# Patient Record
Sex: Male | Born: 1990 | Race: Asian | Hispanic: No | Marital: Single | Smoking: Current some day smoker
Health system: Southern US, Community
[De-identification: ages and names within clinical notes are randomized; demographics above are authoritative.]

---

## 2017-03-22 ENCOUNTER — Emergency Department (HOSPITAL_COMMUNITY)
Admission: EM | Admit: 2017-03-22 | Discharge: 2017-03-22 | Disposition: A | Discharge status: Home / Self Care | Payer: Managed Care, Other (non HMO) | Attending: Emergency Medicine | Admitting: Emergency Medicine

## 2017-03-22 ENCOUNTER — Encounter (HOSPITAL_COMMUNITY): Payer: Self-pay | Admitting: Emergency Medicine

## 2017-03-22 DIAGNOSIS — R1010 Upper abdominal pain, unspecified: Secondary | ICD-10-CM

## 2017-03-22 DIAGNOSIS — K3184 Gastroparesis: Secondary | ICD-10-CM | POA: Insufficient documentation

## 2017-03-22 DIAGNOSIS — F17200 Nicotine dependence, unspecified, uncomplicated: Secondary | ICD-10-CM | POA: Diagnosis not present

## 2017-03-22 LAB — HEPATIC FUNCTION PANEL
ALBUMIN: 4.4 g/dL (ref 3.5–5.0)
ALT: 50 U/L (ref 17–63)
AST: 29 U/L (ref 15–41)
Alkaline Phosphatase: 84 U/L (ref 38–126)
BILIRUBIN TOTAL: 0.7 mg/dL (ref 0.3–1.2)
Bilirubin, Direct: 0.2 mg/dL (ref 0.1–0.5)
Indirect Bilirubin: 0.5 mg/dL (ref 0.3–0.9)
TOTAL PROTEIN: 7.4 g/dL (ref 6.5–8.1)

## 2017-03-22 LAB — CBC WITH DIFFERENTIAL/PLATELET
BASOS PCT: 0 %
Basophils Absolute: 0 10*3/uL (ref 0.0–0.1)
EOS ABS: 2.2 10*3/uL — AB (ref 0.0–0.7)
EOS PCT: 17 %
HCT: 49.3 % (ref 39.0–52.0)
HEMOGLOBIN: 16.7 g/dL (ref 13.0–17.0)
LYMPHS ABS: 3.7 10*3/uL (ref 0.7–4.0)
Lymphocytes Relative: 29 %
MCH: 27 pg (ref 26.0–34.0)
MCHC: 33.9 g/dL (ref 30.0–36.0)
MCV: 79.6 fL (ref 78.0–100.0)
MONOS PCT: 5 %
Monocytes Absolute: 0.6 10*3/uL (ref 0.1–1.0)
NEUTROS PCT: 49 %
Neutro Abs: 6 10*3/uL (ref 1.7–7.7)
PLATELETS: 207 10*3/uL (ref 150–400)
RBC: 6.19 MIL/uL — ABNORMAL HIGH (ref 4.22–5.81)
RDW: 12.5 % (ref 11.5–15.5)
WBC: 12.5 10*3/uL — ABNORMAL HIGH (ref 4.0–10.5)

## 2017-03-22 LAB — I-STAT CHEM 8, ED
BUN: 10 mg/dL (ref 6–20)
CALCIUM ION: 1.07 mmol/L — AB (ref 1.15–1.40)
Chloride: 100 mmol/L — ABNORMAL LOW (ref 101–111)
Creatinine, Ser: 0.8 mg/dL (ref 0.61–1.24)
Glucose, Bld: 93 mg/dL (ref 65–99)
HEMATOCRIT: 51 % (ref 39.0–52.0)
HEMOGLOBIN: 17.3 g/dL — AB (ref 13.0–17.0)
Potassium: 3.6 mmol/L (ref 3.5–5.1)
SODIUM: 141 mmol/L (ref 135–145)
TCO2: 28 mmol/L (ref 0–100)

## 2017-03-22 LAB — LIPASE, BLOOD: LIPASE: 39 U/L (ref 11–51)

## 2017-03-22 MED ORDER — RANITIDINE HCL 150 MG PO CAPS: 150.0000 mg | ORAL_CAPSULE | Freq: Every day | ORAL | 0 refills | Status: AC

## 2017-03-22 MED ORDER — OMEPRAZOLE 20 MG PO CPDR: 20.0000 mg | DELAYED_RELEASE_CAPSULE | Freq: Every day | ORAL | 0 refills | Status: AC

## 2017-03-22 NOTE — ED Triage Notes (Signed)
Pt states he has been having belly pain for 2-3 weeks. Comes and goes. Notices it is worse when he has an empty stomach...better when he eats or drinks something. Denies N/V/D

## 2017-03-22 NOTE — ED Notes (Signed)
Pt understood dc material. NAD Noted. Scripts given at dc. 

## 2017-03-22 NOTE — ED Provider Notes (Signed)
[facility]ENCY DEPT Provider Note   CSN: [id number] Arrival date & time: 03/22/17  0439     History   Chief Complaint Chief Complaint  Patient presents with  . Abdominal Pain    HPI Jason Dixon is a 26 y.o. male.  HPI   Generally healthy 26 year old male presenting complaining of abdominal pain. Patient states for the past 3 weeks he has been experiencing pain is periumbilical region and lower abdomen that has been intermittent nature, burning sensation, usually worse on an empty stomach and improves with eating. Pain has been increasing in frequency. He has been taking Pepto-Bismol initially with some improvement but now it does not provide adequate improvement. He works third shift and after and episodes of pain earlier tonight he decided to come to ER for further evaluation since urgent care is open at this time. Patient states pain is minimal currently. He smokes on occasion, denies alcohol abuse, admits to eating spicy food but denies alcohol abuse. He denies having fever, chills, lightheadedness, dizziness, chest pain, shortness of breath, nausea vomiting diarrhea, dysuria, or rash. Patient has not noticed any blood in his stool. States that his stool is a bit dark yesterday but actually to taking Pepto-Bismol  History reviewed. No pertinent past medical history.  There are no active problems to display for this patient.   History reviewed. No pertinent surgical history.     Home Medications    Prior to Admission medications   Not on File    Family History No family history on file.  Social History Social History  Substance Use Topics  . Smoking status: Current Some Day Smoker  . Smokeless tobacco: Never Used  . Alcohol use Not on file     Allergies   Patient has no allergy information on record.   Review of Systems Review of Systems  All other systems reviewed and are negative.    Physical Exam Updated Vital Signs BP (!) 112/98   Pulse 92    Temp 98.3 F (36.8 C)   Resp 18   Ht 6\' 1"  (1.854 m)   Wt 77.1 kg (170 lb)   SpO2 98%   BMI 22.43 kg/m   Physical Exam  Constitutional: He appears well-developed and well-nourished. No distress.  Well-appearing in no acute discomfort.  HENT:  Head: Atraumatic.  Eyes: Conjunctivae are normal.  Neck: Neck supple.  Cardiovascular: Normal rate and regular rhythm.   Pulmonary/Chest: Effort normal and breath sounds normal. No respiratory distress. He has no wheezes. He has no rales.  Abdominal: Soft. Bowel sounds are normal. He exhibits no distension. There is tenderness (Mild periumbilical tenderness and epigastric tenderness without guarding or rebound tenderness. Negative Murphy sign, no pain at McBurney's point.).  Neurological: He is alert.  Skin: No rash noted.  Psychiatric: He has a normal mood and affect.  Nursing note and vitals reviewed.    ED Treatments / Results  Labs (all labs ordered are listed, but only abnormal results are displayed) Labs Reviewed  CBC WITH DIFFERENTIAL/PLATELET - Abnormal; Notable for the following:       Result Value   WBC 12.5 (*)    RBC 6.19 (*)    Eosinophils Absolute 2.2 (*)    All other components within normal limits  I-STAT CHEM 8, ED - Abnormal; Notable for the following:    Chloride 100 (*)    Calcium, Ion 1.07 (*)    Hemoglobin 17.3 (*)    All other components within normal limits  HEPATIC FUNCTION  PANEL  LIPASE, BLOOD    EKG  EKG Interpretation None       Radiology No results found.  Procedures Procedures (including critical care time)  Medications Ordered in ED Medications - No data to display   Initial Impression / Assessment and Plan / ED Course  I have reviewed the triage vital signs and the nursing notes.  Pertinent labs & imaging results that were available during my care of the patient were reviewed by me and considered in my medical decision making (see chart for details).     BP (!) 112/98   Pulse  92   Temp 98.3 F (36.8 C)   Resp 18   Ht 6\' 1"  (1.854 m)   Wt 77.1 kg (170 lb)   SpO2 98%   BMI 22.43 kg/m    Final Clinical Impressions(s) / ED Diagnoses   Final diagnoses:  Pain of upper abdomen    New Prescriptions New Prescriptions   OMEPRAZOLE (PRILOSEC) 20 MG CAPSULE    Take 1 capsule (20 mg total) by mouth daily.   RANITIDINE (ZANTAC) 150 MG CAPSULE    Take 1 capsule (150 mg total) by mouth daily.   5:10 AM Patient here with epigastric and periumbilical abdominal tenderness suggestive of gastritis/duodenitis. No concerning features to suggest acute abdominal pathology such as appendicitis, or biliary disease.  5:55 AM Electrolytes panel are reassuring. Mildly elevated WBC of 12.5 however this is nonspecific and could be reactive. Normal hepatic function panel, normal lipase. At this time patient is stable for discharge with symptomatic treatment and outpatient follow-up with GI specialist for further care.  Care discussed with Dr. Patria Maneampos.    Fayrene Helperran, Batul Diego, PA-C 03/22/17 0600    Fayrene Helperran, Jaiyana Canale, PA-C 03/22/17 [id number]    Azalia Bilisampos, Kevin, MD 03/25/17 [phone]

## 2017-03-23 ENCOUNTER — Encounter (HOSPITAL_COMMUNITY): Payer: Self-pay

## 2017-03-23 ENCOUNTER — Other Ambulatory Visit: Payer: Self-pay | Admitting: Gastroenterology

## 2017-03-23 ENCOUNTER — Emergency Department (HOSPITAL_COMMUNITY)
Admission: EM | Admit: 2017-03-23 | Discharge: 2017-03-24 | Disposition: A | Discharge status: Home / Self Care | Payer: Managed Care, Other (non HMO) | Attending: Emergency Medicine | Admitting: Emergency Medicine

## 2017-03-23 ENCOUNTER — Emergency Department (HOSPITAL_COMMUNITY): Payer: Managed Care, Other (non HMO)

## 2017-03-23 DIAGNOSIS — F17200 Nicotine dependence, unspecified, uncomplicated: Secondary | ICD-10-CM | POA: Diagnosis not present

## 2017-03-23 DIAGNOSIS — Z79899 Other long term (current) drug therapy: Secondary | ICD-10-CM | POA: Insufficient documentation

## 2017-03-23 DIAGNOSIS — R1031 Right lower quadrant pain: Secondary | ICD-10-CM

## 2017-03-23 DIAGNOSIS — K529 Noninfective gastroenteritis and colitis, unspecified: Secondary | ICD-10-CM | POA: Diagnosis not present

## 2017-03-23 LAB — CBC WITH DIFFERENTIAL/PLATELET
Basophils Absolute: 0 10*3/uL (ref 0.0–0.1)
Basophils Relative: 0 %
EOS ABS: 0.2 10*3/uL (ref 0.0–0.7)
EOS PCT: 2 %
HCT: 44.5 % (ref 39.0–52.0)
HEMOGLOBIN: 14.8 g/dL (ref 13.0–17.0)
LYMPHS ABS: 1.7 10*3/uL (ref 0.7–4.0)
Lymphocytes Relative: 20 %
MCH: 26.4 pg (ref 26.0–34.0)
MCHC: 33.3 g/dL (ref 30.0–36.0)
MCV: 79.5 fL (ref 78.0–100.0)
MONO ABS: 0.5 10*3/uL (ref 0.1–1.0)
MONOS PCT: 6 %
Neutro Abs: 6 10*3/uL (ref 1.7–7.7)
Neutrophils Relative %: 72 %
Platelets: 213 10*3/uL (ref 150–400)
RBC: 5.6 MIL/uL (ref 4.22–5.81)
RDW: 12.4 % (ref 11.5–15.5)
WBC: 8.4 10*3/uL (ref 4.0–10.5)

## 2017-03-23 LAB — BASIC METABOLIC PANEL
Anion gap: 9 (ref 5–15)
BUN: 6 mg/dL (ref 6–20)
CHLORIDE: 104 mmol/L (ref 101–111)
CO2: 25 mmol/L (ref 22–32)
Calcium: 9 mg/dL (ref 8.9–10.3)
Creatinine, Ser: 1.05 mg/dL (ref 0.61–1.24)
GFR calc Af Amer: >60 mL/min (ref >60)
GFR calc non Af Amer: >60 mL/min (ref >60)
Glucose, Bld: 138 mg/dL — ABNORMAL HIGH (ref 65–99)
Potassium: 3.8 mmol/L (ref 3.5–5.1)
SODIUM: 138 mmol/L (ref 135–145)

## 2017-03-23 LAB — URINALYSIS, ROUTINE W REFLEX MICROSCOPIC
Bilirubin Urine: NEGATIVE
GLUCOSE, UA: NEGATIVE mg/dL
Hgb urine dipstick: NEGATIVE
Ketones, ur: NEGATIVE mg/dL
Nitrite: NEGATIVE
PROTEIN: NEGATIVE mg/dL
Specific Gravity, Urine: 1.023 (ref 1.005–1.030)
Squamous Epithelial / LPF: NONE SEEN
pH: 7 (ref 5.0–8.0)

## 2017-03-23 LAB — POC OCCULT BLOOD, ED: Fecal Occult Bld: NEGATIVE

## 2017-03-23 LAB — LIPASE, BLOOD: LIPASE: 38 U/L (ref 11–51)

## 2017-03-23 MED ORDER — IOPAMIDOL (ISOVUE-300) INJECTION 61%
INTRAVENOUS | Status: AC
  Administered 2017-03-23: 100 mL
  Filled 2017-03-23: qty 100

## 2017-03-23 MED ORDER — SODIUM CHLORIDE 0.9 % IV BOLUS (SEPSIS)
1000.0000 mL | Freq: Once | INTRAVENOUS | Status: AC
  Administered 2017-03-23: 1000 mL via INTRAVENOUS

## 2017-03-23 MED ORDER — MORPHINE SULFATE (PF) 4 MG/ML IV SOLN
4.0000 mg | Freq: Once | INTRAVENOUS | Status: AC
  Administered 2017-03-23: 4 mg via INTRAVENOUS
  Filled 2017-03-23: qty 1

## 2017-03-23 MED ORDER — ONDANSETRON 4 MG PO TBDP
4.0000 mg | ORAL_TABLET | Freq: Once | ORAL | Status: AC
  Administered 2017-03-23: 4 mg via ORAL

## 2017-03-23 MED ORDER — ONDANSETRON HCL 4 MG/2ML IJ SOLN
4.0000 mg | Freq: Once | INTRAMUSCULAR | Status: AC
  Administered 2017-03-23: 4 mg via INTRAVENOUS
  Filled 2017-03-23: qty 2

## 2017-03-23 MED ORDER — ONDANSETRON 4 MG PO TBDP
ORAL_TABLET | ORAL | Status: AC
  Filled 2017-03-23: qty 1

## 2017-03-23 NOTE — ED Provider Notes (Signed)
[facility] Provider Note   CSN: [id number] Arrival date & time: 03/23/17  1858     History   Chief Complaint Chief Complaint  Patient presents with  . Abdominal Pain    HPI Jason Dixon is a 26 y.o. male.  HPI   Patient is a 26 year old male with no pertinent past medical history presents the ED with complaint of constant worsening right lower abdominal pain. Patient reports he has been having waxing and waning pain to his abdomen for the past 2 weeks but notes yesterday morning it has significantly worsened. He reports now having constant sharp burning pain to his right lower quadrant which is worsened when laying supine. Denies any alleviating factors. Endorses associated nausea. Patient also reports having intermittent black loose stools for the past 2 days. He notes he was initially seen in the ED Monday morning for his symptoms, noted to have mildly elevated WBC of 12.5, labs otherwise unremarkable. Patient was diagnosed with suspected gastritis and discharged home with Prilosec, Zantac and outpatient GI follow-up. Patient reports seen GI in the clinic today and states he was advised to come to the ED to have a CT scan performed due to concern for appendicitis. Patient reports he has been taking his prescriptions a Prilosec and Zantac without relief of symptoms. Denies fever, chills, cough, shortness of breath, chest pain, vomiting, urinary symptoms, flank pain, penile or testicular pain/swelling. Denies history of abdominal surgeries.  History reviewed. No pertinent past medical history.  There are no active problems to display for this patient.   History reviewed. No pertinent surgical history.     Home Medications    Prior to Admission medications   Medication Sig Start Date End Date Taking? Authorizing Provider  omeprazole (PRILOSEC) 20 MG capsule Take 1 capsule (20 mg total) by mouth daily. 03/22/17  Yes Fayrene Helper, PA-C  ranitidine (ZANTAC) 150 MG capsule  Take 1 capsule (150 mg total) by mouth daily. 03/22/17  Yes Fayrene Helper, PA-C  dicyclomine (BENTYL) 20 MG tablet Take 1 tablet (20 mg total) by mouth 2 (two) times daily. 03/24/17   Barrett Henle, PA-C  loperamide (IMODIUM) 2 MG capsule Take 1 capsule (2 mg total) by mouth 4 (four) times daily as needed for diarrhea or loose stools. 03/24/17   Barrett Henle, PA-C  ondansetron (ZOFRAN ODT) 4 MG disintegrating tablet Take 1 tablet (4 mg total) by mouth every 8 (eight) hours as needed for nausea or vomiting. 03/24/17   Barrett Henle, PA-C    Family History History reviewed. No pertinent family history.  Social History Social History  Substance Use Topics  . Smoking status: Current Some Day Smoker  . Smokeless tobacco: Never Used  . Alcohol use Not on file     Allergies   Patient has no known allergies.   Review of Systems Review of Systems  Gastrointestinal: Positive for abdominal pain and nausea.       Black stool  All other systems reviewed and are negative.    Physical Exam Updated Vital Signs BP 122/82   Pulse (!) 117   Temp 99.2 F (37.3 C) (Oral)   Resp 20   SpO2 98%   Physical Exam  Constitutional: He is oriented to person, place, and time. He appears well-developed and well-nourished.  Pt sitting on edge of chair intermittently grimacing throughout exam, appears to be in moderate discomfort.  HENT:  Head: Normocephalic and atraumatic.  Mouth/Throat: Uvula is midline, oropharynx is clear and moist and  mucous membranes are normal. No oropharyngeal exudate, posterior oropharyngeal edema, posterior oropharyngeal erythema or tonsillar abscesses. No tonsillar exudate.  Eyes: Conjunctivae and EOM are normal. Right eye exhibits no discharge. Left eye exhibits no discharge. No scleral icterus.  Neck: Normal range of motion. Neck supple.  Cardiovascular: Normal rate, regular rhythm, normal heart sounds and intact distal pulses.   Pulmonary/Chest:  Effort normal and breath sounds normal. No respiratory distress. He has no wheezes. He has no rales. He exhibits no tenderness.  Abdominal: Soft. Normal appearance and bowel sounds are normal. He exhibits no distension and no mass. There is tenderness. There is rebound and tenderness at McBurney's point. There is no rigidity, no guarding, no CVA tenderness and negative Murphy's sign. No hernia.  Mild diffuse tenderness, worse over periumbilical and RLQ.   Genitourinary: Rectum normal. Rectal exam shows no external hemorrhoid, no internal hemorrhoid, no mass and no tenderness.  Genitourinary Comments: No gross blood on DRE  Musculoskeletal: Normal range of motion. He exhibits no edema.  Neurological: He is alert and oriented to person, place, and time.  Skin: Skin is warm and dry.  Nursing note and vitals reviewed.    ED Treatments / Results  Labs (all labs ordered are listed, but only abnormal results are displayed) Labs Reviewed  BASIC METABOLIC PANEL - Abnormal; Notable for the following:       Result Value   Glucose, Bld 138 (*)    All other components within normal limits  URINALYSIS, ROUTINE W REFLEX MICROSCOPIC - Abnormal; Notable for the following:    Leukocytes, UA TRACE (*)    Bacteria, UA RARE (*)    All other components within normal limits  CBC WITH DIFFERENTIAL/PLATELET  LIPASE, BLOOD  POC OCCULT BLOOD, ED    EKG  EKG Interpretation None       Radiology Ct Abdomen Pelvis W Contrast  Result Date: 03/23/2017 CLINICAL DATA:  Right lower quadrant abdominal pain. EXAM: CT ABDOMEN AND PELVIS WITH CONTRAST TECHNIQUE: Multidetector CT imaging of the abdomen and pelvis was performed using the standard protocol following bolus administration of intravenous contrast. CONTRAST:  ISOVUE-300 IOPAMIDOL (ISOVUE-300) INJECTION 61% COMPARISON:  None. FINDINGS: Lower chest: The lung bases are clear. Hepatobiliary: No focal liver abnormality is seen. No gallstones, gallbladder  wall thickening, or biliary dilatation. Pancreas: No ductal dilatation or inflammation. Spleen: Normal in size without focal abnormality. Adrenals/Urinary Tract: Normal adrenal glands. No hydronephrosis or perinephric edema. Homogeneous renal enhancement. Urinary bladder is physiologically distended, no bladder wall thickening. Stomach/Bowel: Normal appendix well-visualized on the right lower quadrant. Stomach is physiologically distended. Prominent fluid-filled small bowel in the central abdomen without wall thickening or surrounding inflammation. Terminal ileum is normal. Small volume of stool throughout the colon, no colonic wall thickening. Vascular/Lymphatic: Small retroperitoneal nodes are not enlarged by size criteria. No acute vascular findings are present. Reproductive: Prostate is unremarkable. Other: No free air, free fluid, or intra-abdominal fluid collection. Musculoskeletal: There are no acute or suspicious osseous abnormalities. IMPRESSION: 1. Normal appendix. 2. Prominent fluid-filled small bowel in the central abdomen, can be seen with enteritis or ileus. No bowel inflammation or wall thickening. Electronically Signed   By: Rubye Oaks M.D.   On: 03/23/2017 22:35    Procedures Procedures (including critical care time)  Medications Ordered in ED Medications  ondansetron (ZOFRAN-ODT) 4 MG disintegrating tablet (not administered)  ondansetron (ZOFRAN-ODT) disintegrating tablet 4 mg (4 mg Oral Given 03/23/17 1935)  sodium chloride 0.9 % bolus 1,000 mL (0 mLs Intravenous  Stopped 03/23/17 2348)  ondansetron (ZOFRAN) injection 4 mg (4 mg Intravenous Given 03/23/17 2143)  morphine 4 MG/ML injection 4 mg (4 mg Intravenous Given 03/23/17 2143)  iopamidol (ISOVUE-300) 61 % injection (100 mLs  Contrast Given 03/23/17 2213)     Initial Impression / Assessment and Plan / ED Course  I have reviewed the triage vital signs and the nursing notes.  Pertinent labs & imaging results that were available  during my care of the patient were reviewed by me and considered in my medical decision making (see chart for details).     Patient presents with worsening right lower quadrant abdominal pain that has been present for the past 2 weeks but worsened yesterday morning. Endorses associated nausea and black loose stool. Reports he was evaluated by GI prior to arrival and advised to come to the ED for CT scan due to concern for appendicitis. Denies fever. VSS. Exam revealed mild diffuse abdominal tenderness, worse over periumbilical and right lower quadrant with positive rebound and McBurney's point tenderness. Patient given IV fluids, pain meds and Zofran in the ED. CT abdomen ordered for further evaluation of worsening abdominal pain.   Chart review shows patient was seen in the ED yesterday morning for similar symptoms. Patient with mild leukocytosis, otherwise labs unremarkable. Patient was discharged home with Prilosec and Zantac and advised to follow up with GI for further care.  Labs and urine unremarkable. Negative Hemoccult. CT abdomen revealed normal appendix, evidence suggesting enteritis, no bowel inflammation or wall thickening. On reevaluation patient reports significant improvement of symptoms. Pt tolerating PO. Discussed results and plan for discharge with patient. Plan to discharge patient home with symptom back treatment for viral gastroenteritis. Advised patient to follow up with GI in 3-4 days as needed. Discussed return precautions.  Final Clinical Impressions(s) / ED Diagnoses   Final diagnoses:  Gastroenteritis    New Prescriptions New Prescriptions   DICYCLOMINE (BENTYL) 20 MG TABLET    Take 1 tablet (20 mg total) by mouth 2 (two) times daily.   LOPERAMIDE (IMODIUM) 2 MG CAPSULE    Take 1 capsule (2 mg total) by mouth 4 (four) times daily as needed for diarrhea or loose stools.   ONDANSETRON (ZOFRAN ODT) 4 MG DISINTEGRATING TABLET    Take 1 tablet (4 mg total) by mouth every 8  (eight) hours as needed for nausea or vomiting.     Barrett Henleadeau, Nicole Elizabeth, PA-C 03/24/17 [id number]    Pricilla LovelessGoldston, Scott, MD 03/25/17 [phone]

## 2017-03-23 NOTE — ED Triage Notes (Signed)
Pt presents with continued RLQ abdominal pain. Pt seen here early Monday morning.  Pt was discharged without imaging; was to follow up with GI which he did today; was referred here for increased pain and CT scan.  Pt reports having black stools, constant nausea.

## 2017-03-24 ENCOUNTER — Other Ambulatory Visit: Payer: Managed Care, Other (non HMO)

## 2017-03-24 MED ORDER — ONDANSETRON 4 MG PO TBDP: 4.0000 mg | ORAL_TABLET | Freq: Three times a day (TID) | ORAL | 0 refills | Status: AC | PRN

## 2017-03-24 MED ORDER — DICYCLOMINE HCL 20 MG PO TABS: 20.0000 mg | ORAL_TABLET | Freq: Two times a day (BID) | ORAL | 0 refills | Status: AC

## 2017-03-24 MED ORDER — LOPERAMIDE HCL 2 MG PO CAPS: 2.0000 mg | ORAL_CAPSULE | Freq: Four times a day (QID) | ORAL | 0 refills | Status: AC | PRN

## 2017-03-24 NOTE — Discharge Instructions (Signed)
Take your medications as prescribed as needed for nausea, vomiting and/or diarrhea. Continue to give fluids at home to remain hydrated. I recommend eating a bland diet for the next few days until her symptoms have improved. I recommend following up with her gastroenterologist in 3-4 days if your symptoms have not improved or have worsened. Please return to the Emergency Department if symptoms worsen or new onset of fever, chest pain, difficulty breathing, new/worsening abdominal pain, vomiting blood, able to keep fluids down, blood in urine or stool.

## 2018-08-18 IMAGING — CT CT ABD-PELV W/ CM
2 of 4 series · 16 of 46 positions shown, 18 images · IV contrast (Omni 300)
Comparison: None.

CLINICAL DATA: Right lower quadrant abdominal pain.

EXAM:
CT ABDOMEN AND PELVIS WITH CONTRAST
TECHNIQUE: Multidetector CT imaging of the abdomen and pelvis was performed
using the standard protocol following bolus administration of
intravenous contrast.
CONTRAST:  100mL [id number] IOPAMIDOL ([id number]) INJECTION 61%

[Series 3: a/p w/ 5mm · axial · 0.79mm/px · z∈[+934,+1364]mm · 13 of 94 slices shown, 15 images]
[im 4/94  soft-tissue]
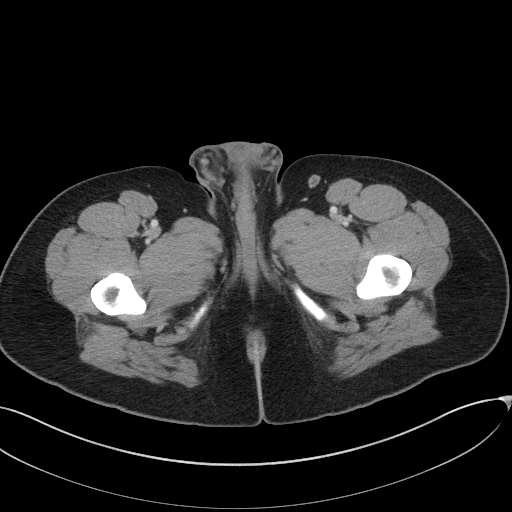
[im 4/94  bone]
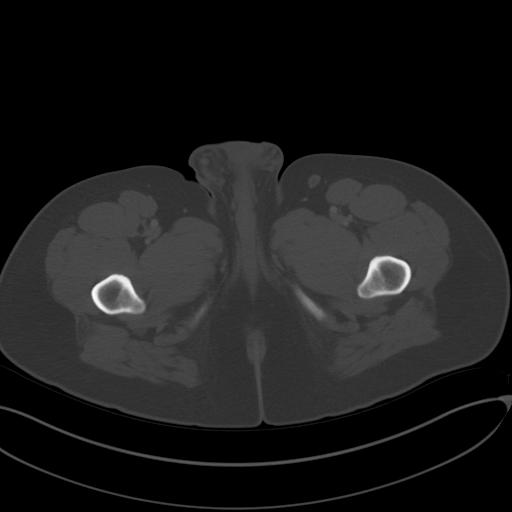
[im 12/94  soft-tissue]
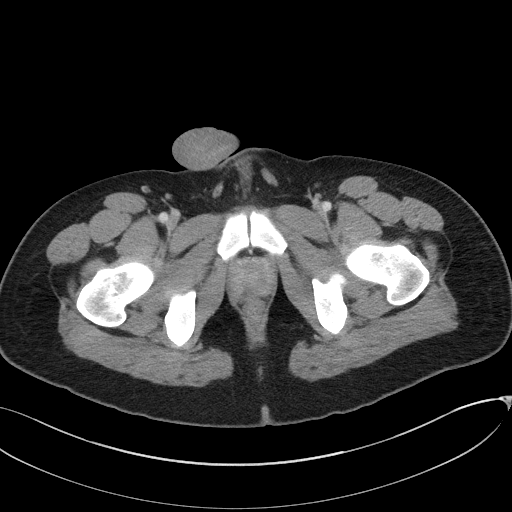
[im 19/94  soft-tissue]
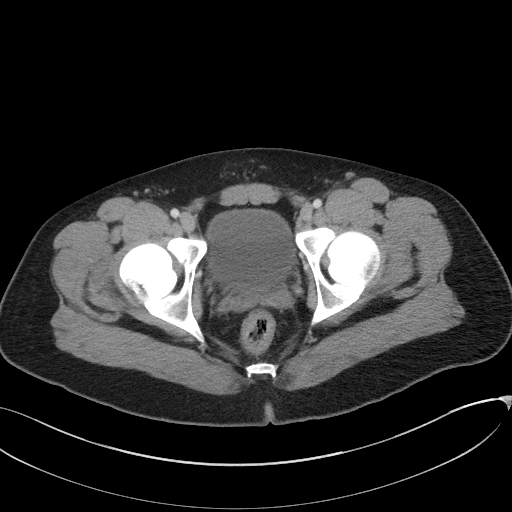
[im 27/94  soft-tissue]
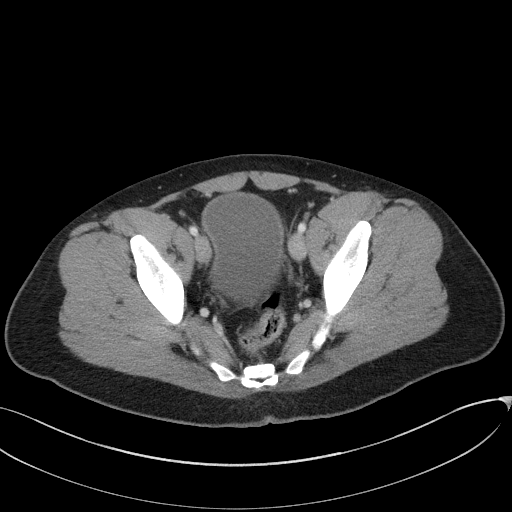
[im 34/94  soft-tissue]
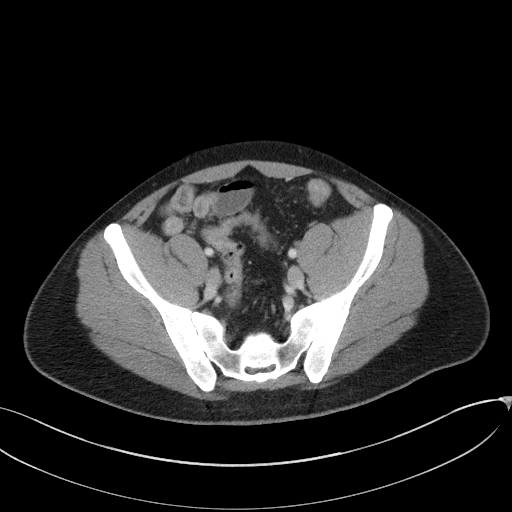
[im 41/94  soft-tissue]
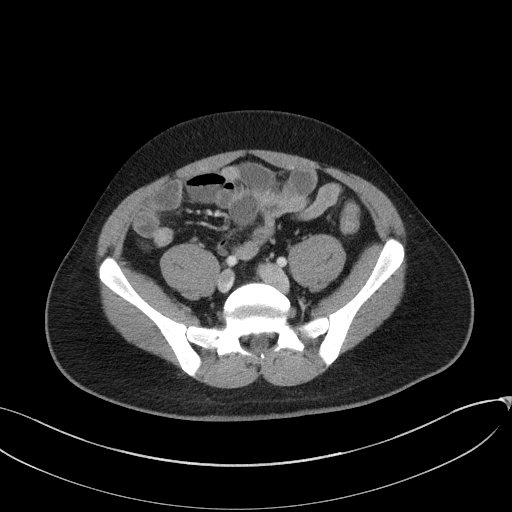
[im 49/94  soft-tissue]
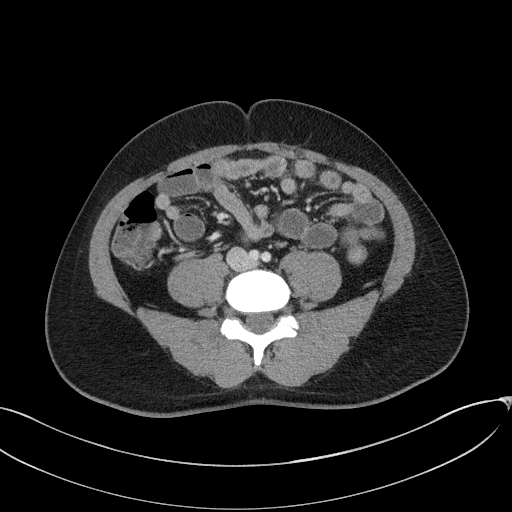
[im 53/94  soft-tissue]
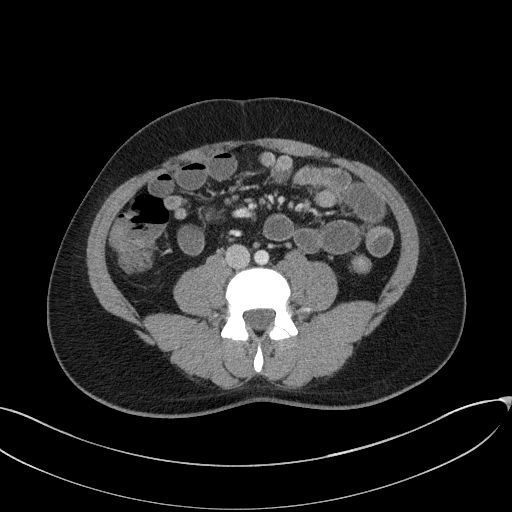
[im 60/94  soft-tissue]
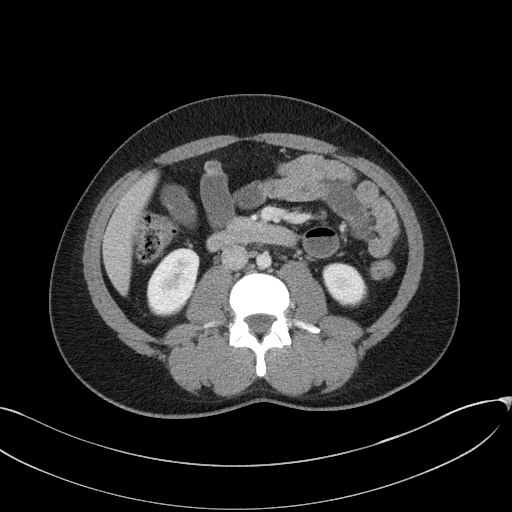
[im 60/94  bone]
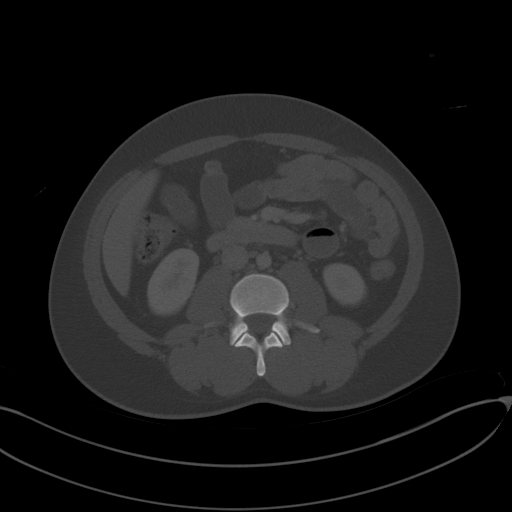
[im 67/94  soft-tissue]
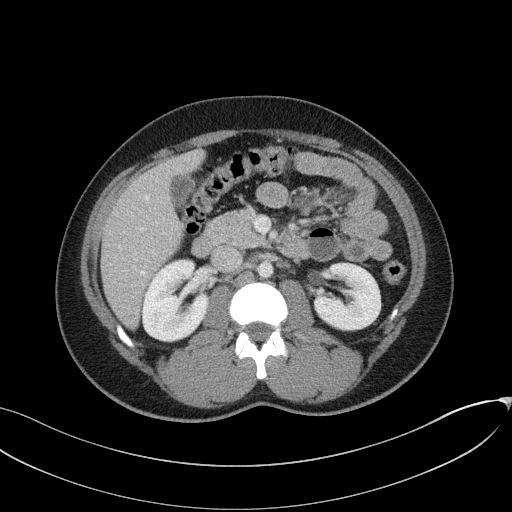
[im 75/94  soft-tissue]
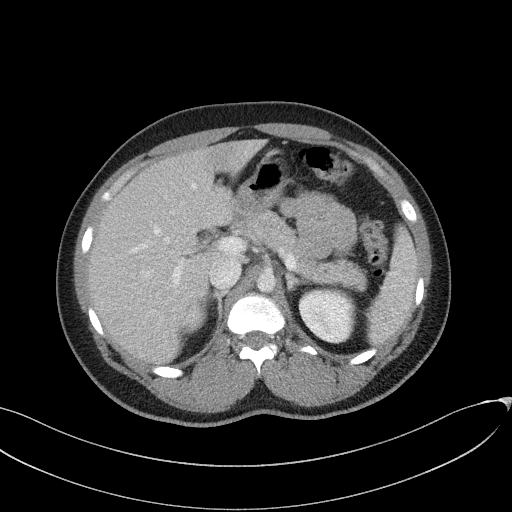
[im 82/94  soft-tissue]
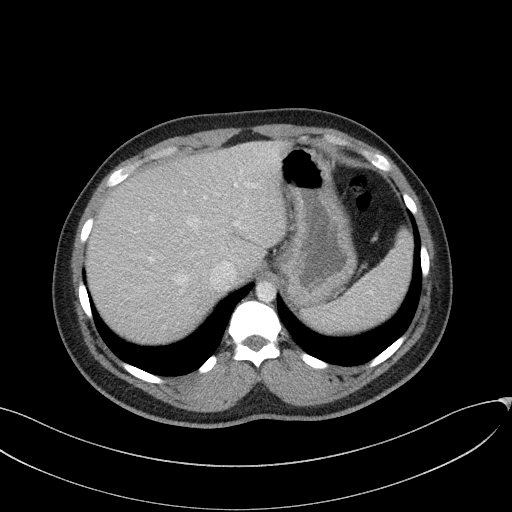
[im 90/94  soft-tissue]
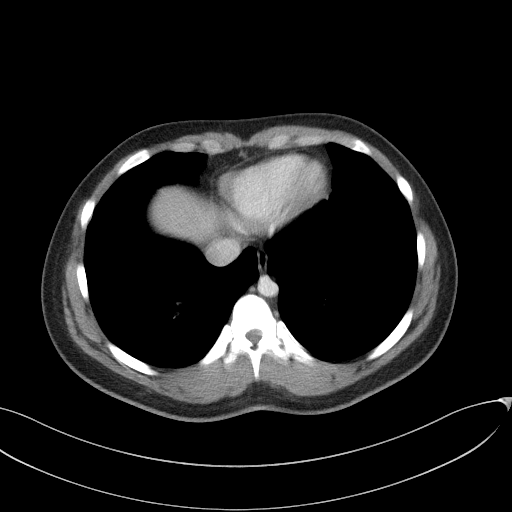

[Series 6: a/p w/ cor · coronal · 0.74mm/px · 3 of 149 slices shown]
[im 50/149  soft-tissue]
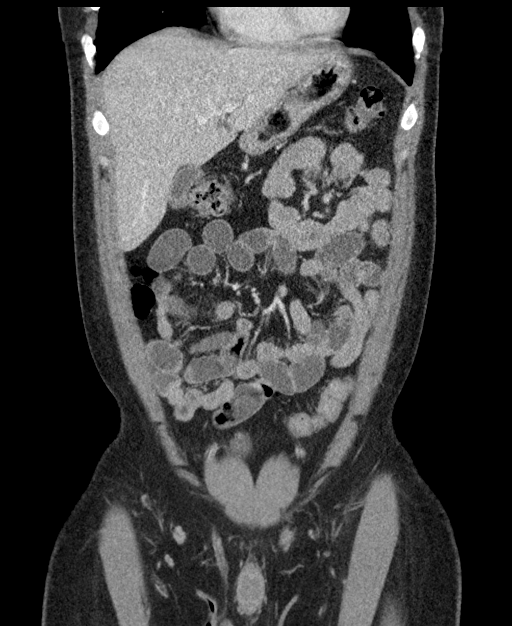
[im 66/149  soft-tissue]
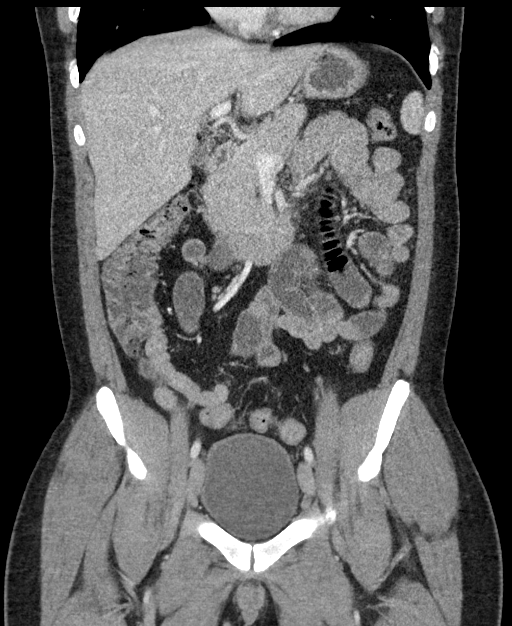
[im 83/149  soft-tissue]
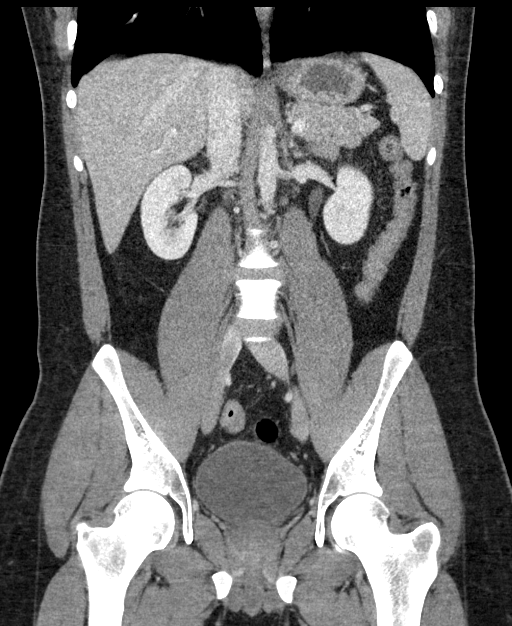

[16 of 46 positions shown; findings below may reference images not displayed]

FINDINGS: Lower chest: The lung bases are clear.

Hepatobiliary: No focal liver abnormality is seen. No gallstones,
gallbladder wall thickening, or biliary dilatation.

Pancreas: No ductal dilatation or inflammation.

Spleen: Normal in size without focal abnormality.

Adrenals/Urinary Tract: Normal adrenal glands. No hydronephrosis or
perinephric edema. Homogeneous renal enhancement. Urinary bladder is
physiologically distended, no bladder wall thickening.

Stomach/Bowel: Normal appendix well-visualized on the right lower
quadrant. Stomach is physiologically distended. Prominent
fluid-filled small bowel in the central abdomen without wall
thickening or surrounding inflammation. Terminal ileum is normal.
Small volume of stool throughout the colon, no colonic wall
thickening.

Vascular/Lymphatic: Small retroperitoneal nodes are not enlarged by
size criteria. No acute vascular findings are present.

Reproductive: Prostate is unremarkable.

Other: No free air, free fluid, or intra-abdominal fluid collection.

Musculoskeletal: There are no acute or suspicious osseous
abnormalities.
IMPRESSION: 1. Normal appendix.
2. Prominent fluid-filled small bowel in the central abdomen, can be
seen with enteritis or ileus. No bowel inflammation or wall
thickening.

## 2018-10-10 ENCOUNTER — Encounter (HOSPITAL_COMMUNITY): Payer: Self-pay

## 2018-10-10 ENCOUNTER — Emergency Department (HOSPITAL_COMMUNITY)
Admission: EM | Admit: 2018-10-10 | Discharge: 2018-10-10 | Disposition: A | Discharge status: Home / Self Care | Payer: Managed Care, Other (non HMO) | Attending: Emergency Medicine | Admitting: Emergency Medicine

## 2018-10-10 DIAGNOSIS — Z79899 Other long term (current) drug therapy: Secondary | ICD-10-CM | POA: Insufficient documentation

## 2018-10-10 DIAGNOSIS — F17200 Nicotine dependence, unspecified, uncomplicated: Secondary | ICD-10-CM | POA: Diagnosis not present

## 2018-10-10 DIAGNOSIS — R509 Fever, unspecified: Secondary | ICD-10-CM

## 2018-10-10 DIAGNOSIS — J111 Influenza due to unidentified influenza virus with other respiratory manifestations: Secondary | ICD-10-CM | POA: Insufficient documentation

## 2018-10-10 DIAGNOSIS — R6889 Other general symptoms and signs: Secondary | ICD-10-CM

## 2018-10-10 DIAGNOSIS — J029 Acute pharyngitis, unspecified: Secondary | ICD-10-CM | POA: Diagnosis present

## 2018-10-10 LAB — GROUP A STREP BY PCR: Group A Strep by PCR: NOT DETECTED

## 2018-10-10 MED ORDER — ACETAMINOPHEN 500 MG PO TABS
1000.0000 mg | ORAL_TABLET | Freq: Once | ORAL | Status: AC
  Administered 2018-10-10: 1000 mg via ORAL
  Filled 2018-10-10: qty 2

## 2018-10-10 MED ORDER — ALUM & MAG HYDROXIDE-SIMETH 200-200-20 MG/5ML PO SUSP
30.0000 mL | Freq: Once | ORAL | Status: AC
  Administered 2018-10-10: 30 mL via ORAL
  Filled 2018-10-10: qty 30

## 2018-10-10 MED ORDER — IBUPROFEN 600 MG PO TABS: 600.0000 mg | ORAL_TABLET | Freq: Four times a day (QID) | ORAL | 0 refills | Status: AC | PRN

## 2018-10-10 MED ORDER — IBUPROFEN 800 MG PO TABS
800.0000 mg | ORAL_TABLET | Freq: Once | ORAL | Status: AC
  Administered 2018-10-10: 800 mg via ORAL
  Filled 2018-10-10: qty 1

## 2018-10-10 MED ORDER — LIDOCAINE VISCOUS HCL 2 % MT SOLN
15.0000 mL | Freq: Once | OROMUCOSAL | Status: AC
  Administered 2018-10-10: 15 mL via ORAL
  Filled 2018-10-10: qty 15

## 2018-10-10 MED ORDER — BENZONATATE 100 MG PO CAPS: 100.0000 mg | ORAL_CAPSULE | Freq: Three times a day (TID) | ORAL | 0 refills | Status: AC | PRN

## 2018-10-10 MED ORDER — MAGIC MOUTHWASH W/LIDOCAINE: 5.0000 mL | Freq: Four times a day (QID) | ORAL | 0 refills | Status: AC | PRN

## 2018-10-10 NOTE — ED Provider Notes (Signed)
[facility] Provider Note   CSN: [id number] Arrival date & time: 10/10/18  0112     History   Chief Complaint Chief Complaint  Patient presents with  . Nasal Congestion    Flu-like Symptoms    HPI Jason CoupeDavindra Dixon is a 27 y.o. male.   27 year old male with no significant past medical history presents to the emergency department for evaluation of flulike illness.  States that he began with a sore throat 3 days ago.  Sore throat progressed to diffuse body aches with subjective fever and chills.  He has had a dry cough and has been managing his symptoms with Robitussin, Delsym, TheraFlu.  Also reports taking ibuprofen sporadically for management of throat pain.  Has been tolerating liquids, but states that eating solids aggravates his throat.  He has not had any vomiting, diarrhea, shortness of breath, abdominal pain.  Works in a call center.  Is unsure of any specific sick contacts.  Continues to void normally.  The history is provided by the patient. No language interpreter was used.    History reviewed. No pertinent past medical history.  There are no active problems to display for this patient.   History reviewed. No pertinent surgical history.     Home Medications    Prior to Admission medications   Medication Sig Start Date End Date Taking? Authorizing Provider  benzonatate (TESSALON) 100 MG capsule Take 1 capsule (100 mg total) by mouth 3 (three) times daily as needed for cough. 10/10/18   Antony MaduraHumes, Jorgia Manthei, PA-C  dicyclomine (BENTYL) 20 MG tablet Take 1 tablet (20 mg total) by mouth 2 (two) times daily. 03/24/17   Barrett HenleNadeau, Nicole Elizabeth, PA-C  ibuprofen (ADVIL,MOTRIN) 600 MG tablet Take 1 tablet (600 mg total) by mouth every 6 (six) hours as needed for fever, mild pain or moderate pain. 10/10/18   Antony MaduraHumes, Izeah Vossler, PA-C  loperamide (IMODIUM) 2 MG capsule Take 1 capsule (2 mg total) by mouth 4 (four) times daily as needed for diarrhea or  loose stools. 03/24/17   Barrett HenleNadeau, Nicole Elizabeth, PA-C  magic mouthwash w/lidocaine SOLN Take 5 mLs by mouth 4 (four) times daily as needed for mouth pain. 10/10/18   Antony MaduraHumes, Tamera Pingley, PA-C  omeprazole (PRILOSEC) 20 MG capsule Take 1 capsule (20 mg total) by mouth daily. 03/22/17   Fayrene Helperran, Bowie, PA-C  ondansetron (ZOFRAN ODT) 4 MG disintegrating tablet Take 1 tablet (4 mg total) by mouth every 8 (eight) hours as needed for nausea or vomiting. 03/24/17   Barrett HenleNadeau, Nicole Elizabeth, PA-C  ranitidine (ZANTAC) 150 MG capsule Take 1 capsule (150 mg total) by mouth daily. 03/22/17   Fayrene Helperran, Bowie, PA-C    Family History History reviewed. No pertinent family history.  Social History Social History   Tobacco Use  . Smoking status: Current Some Day Smoker  . Smokeless tobacco: Never Used  Substance Use Topics  . Alcohol use: Not on file  . Drug use: Not on file     Allergies   Patient has no known allergies.   Review of Systems Review of Systems Ten systems reviewed and are negative for acute change, except as noted in the HPI.    Physical Exam Updated Vital Signs BP 118/72   Pulse (!) 110   Temp (!) 100.5 F (38.1 C) (Oral)   Resp 19   SpO2 98%   Physical Exam Vitals signs and nursing note reviewed.  Constitutional:      General: He is not in acute distress.  Appearance: He is well-developed. He is not diaphoretic.     Comments: Nontoxic-appearing and in no distress  HENT:     Head: Normocephalic and atraumatic.     Nose:     Comments: Posterior oropharyngeal erythema.  Scant exudate on tonsils.  No edema.  Uvula midline.  Patient tolerating secretions without difficulty.  No tripoding or voice muffling. Eyes:     General: No scleral icterus.    Conjunctiva/sclera: Conjunctivae normal.  Neck:     Musculoskeletal: Normal range of motion.     Comments: No meningismus Cardiovascular:     Rate and Rhythm: Regular rhythm. Tachycardia present.     Pulses: Normal pulses.     Comments:  Tachycardia in the setting of febrile illness Pulmonary:     Effort: Pulmonary effort is normal. No respiratory distress.     Comments: Respirations even and unlabored Musculoskeletal: Normal range of motion.  Skin:    General: Skin is warm and dry.     Coloration: Skin is not pale.     Findings: No erythema or rash.  Neurological:     Mental Status: He is alert and oriented to person, place, and time.     Coordination: Coordination normal.  Psychiatric:        Behavior: Behavior normal.      ED Treatments / Results  Labs (all labs ordered are listed, but only abnormal results are displayed) Labs Reviewed  GROUP A STREP BY PCR    EKG None  Radiology No results found.  Procedures Procedures (including critical care time)  Medications Ordered in ED Medications  ibuprofen (ADVIL,MOTRIN) tablet 800 mg (800 mg Oral Given 10/10/18 0137)  alum & mag hydroxide-simeth (MAALOX/MYLANTA) 200-200-20 MG/5ML suspension 30 mL (30 mLs Oral Given 10/10/18 0258)    And  lidocaine (XYLOCAINE) 2 % viscous mouth solution 15 mL (15 mLs Oral Given 10/10/18 0258)  acetaminophen (TYLENOL) tablet 1,000 mg (1,000 mg Oral Given 10/10/18 0258)     Initial Impression / Assessment and Plan / ED Course  I have reviewed the triage vital signs and the nursing notes.  Pertinent labs & imaging results that were available during my care of the patient were reviewed by me and considered in my medical decision making (see chart for details).     Patient with symptoms consistent with influenza.  Vitals are stable, low-grade fever.  No signs of dehydration, tolerating PO's.  Lungs are clear.  Doubt PNA given lack of tachypnea, dyspnea, hypoxia.  The patient understands that symptoms are greater than the recommended 24-48 hour window of treatment with [place]fu.  Patient will be discharged with instructions to orally hydrate, rest, and use over-the-counter medications such as ibuprofen for body aches and  Tylenol for fever.  Patient will also be given a cough suppressant.  Return precautions discussed and provided. Patient discharged in stable condition with no unaddressed concerns.   Final Clinical Impressions(s) / ED Diagnoses   Final diagnoses:  Flu-like symptoms  Febrile illness    ED Discharge Orders         Ordered    magic mouthwash w/lidocaine SOLN  4 times daily PRN    Note to Pharmacy:  Doree Barthel, Maalox, Lidocaine at 1:1:1   10/10/18 0340    ibuprofen (ADVIL,MOTRIN) 600 MG tablet  Every 6 hours PRN     10/10/18 0340    benzonatate (TESSALON) 100 MG capsule  3 times daily PRN     10/10/18 0340  Antony MaduraHumes, Brodyn Depuy, PA-C 10/10/18 [id number]    Geoffery Lyonselo, Douglas, MD 10/10/18 [id number]

## 2018-10-10 NOTE — ED Notes (Signed)
Patient verbalizes understanding of medications and discharge instructions. No further questions at this time. VSS and patient ambulatory at discharge.   

## 2018-10-10 NOTE — Discharge Instructions (Signed)
Your symptoms are consistent with influenza. We advise ibuprofen every 6 hours as prescribed. You may alternate this with Tylenol, if desired. Be sure to drink plenty of fluids to prevent dehydration. Use Tessalon for cough and magic mouthwash for sore throat. Follow-up with your primary care doctor in the next 24-48 hours for recheck. You may return for new or concerning symptoms.

## 2018-10-10 NOTE — ED Triage Notes (Signed)
Pt to ER with c/o flu-like symptoms; states that he has had temp,chills, and dry cough with body aches since Friday

## 2021-07-31 ENCOUNTER — Encounter (HOSPITAL_COMMUNITY): Payer: Self-pay | Admitting: Emergency Medicine

## 2021-07-31 ENCOUNTER — Emergency Department (HOSPITAL_COMMUNITY)
Admission: EM | Admit: 2021-07-31 | Discharge: 2021-07-31 | Disposition: A | Discharge status: Home / Self Care | Payer: 59 | Attending: Emergency Medicine | Admitting: Emergency Medicine

## 2021-07-31 ENCOUNTER — Other Ambulatory Visit: Payer: Self-pay

## 2021-07-31 DIAGNOSIS — S5012XA Contusion of left forearm, initial encounter: Secondary | ICD-10-CM | POA: Insufficient documentation

## 2021-07-31 DIAGNOSIS — F17200 Nicotine dependence, unspecified, uncomplicated: Secondary | ICD-10-CM | POA: Insufficient documentation

## 2021-07-31 DIAGNOSIS — X58XXXA Exposure to other specified factors, initial encounter: Secondary | ICD-10-CM | POA: Insufficient documentation

## 2021-07-31 DIAGNOSIS — T148XXA Other injury of unspecified body region, initial encounter: Secondary | ICD-10-CM

## 2021-07-31 DIAGNOSIS — S59912A Unspecified injury of left forearm, initial encounter: Secondary | ICD-10-CM | POA: Diagnosis present

## 2021-07-31 NOTE — ED Provider Notes (Signed)
[facility]-EMERGENCY DEPT Provider Note   CSN: [id number] Arrival date & time: 07/31/21  1823     History Chief Complaint  Patient presents with   Arm Pain    Jason Dixon is a 30 y.o. male who presents for evaluation of left arm pain.  Patient donated plasma 2 days ago.  Yesterday he noticed there was bruising and swelling in the left antecubital fossa.  He had some numbness and tingling in his hand that has resolved.  Today he noticed that the bruising had traveled further down his arm and came in for further evaluation.  He denies weakness, fever or chills.   Arm Pain      History reviewed. No pertinent past medical history.  There are no problems to display for this patient.   History reviewed. No pertinent surgical history.     History reviewed. No pertinent family history.  Social History   Tobacco Use   Smoking status: Some Days   Smokeless tobacco: Never    Home Medications Prior to Admission medications   Medication Sig Start Date End Date Taking? Authorizing Provider  benzonatate (TESSALON) 100 MG capsule Take 1 capsule (100 mg total) by mouth 3 (three) times daily as needed for cough. 10/10/18   Antony Madura, PA-C  dicyclomine (BENTYL) 20 MG tablet Take 1 tablet (20 mg total) by mouth 2 (two) times daily. 03/24/17   Barrett Henle, PA-C  ibuprofen (ADVIL,MOTRIN) 600 MG tablet Take 1 tablet (600 mg total) by mouth every 6 (six) hours as needed for fever, mild pain or moderate pain. 10/10/18   Antony Madura, PA-C  loperamide (IMODIUM) 2 MG capsule Take 1 capsule (2 mg total) by mouth 4 (four) times daily as needed for diarrhea or loose stools. 03/24/17   Barrett Henle, PA-C  magic mouthwash w/lidocaine SOLN Take 5 mLs by mouth 4 (four) times daily as needed for mouth pain. 10/10/18   Antony Madura, PA-C  omeprazole (PRILOSEC) 20 MG capsule Take 1 capsule (20 mg total) by mouth daily. 03/22/17   Fayrene Helper, PA-C   ondansetron (ZOFRAN ODT) 4 MG disintegrating tablet Take 1 tablet (4 mg total) by mouth every 8 (eight) hours as needed for nausea or vomiting. 03/24/17   Barrett Henle, PA-C  ranitidine (ZANTAC) 150 MG capsule Take 1 capsule (150 mg total) by mouth daily. 03/22/17   Fayrene Helper, PA-C    Allergies    Patient has no known allergies.  Review of Systems   Review of Systems  Constitutional:  Negative for chills and fever.  Neurological:  Negative for weakness and numbness.  Hematological:  Does not bruise/bleed easily.   Physical Exam Updated Vital Signs BP 136/84 (BP Location: Left Arm)   Pulse 97   Temp 99.4 F (37.4 C) (Oral)   Resp 18   Ht 6\' 1"  (1.854 m)   SpO2 99%   BMI 22.43 kg/m   Physical Exam Vitals and nursing note reviewed.  Constitutional:      General: He is not in acute distress.    Appearance: He is well-developed. He is not diaphoretic.  HENT:     Head: Normocephalic and atraumatic.  Eyes:     General: No scleral icterus.    Conjunctiva/sclera: Conjunctivae normal.  Cardiovascular:     Rate and Rhythm: Normal rate and regular rhythm.     Heart sounds: Normal heart sounds.  Pulmonary:     Effort: Pulmonary effort is normal. No respiratory distress.  Breath sounds: Normal breath sounds.  Abdominal:     Palpations: Abdomen is soft.     Tenderness: There is no abdominal tenderness.  Musculoskeletal:     Cervical back: Normal range of motion and neck supple.     Comments: Mild bruising of the left proximal forearm. Normal, equal bilateral grip strength, normal finger strength and movement, normal strength at the wrists.  Mild tenderness to palpation of the area of bruising, 2+ radial pulse  Skin:    General: Skin is warm and dry.  Neurological:     Mental Status: He is alert.  Psychiatric:        Behavior: Behavior normal.    ED Results / Procedures / Treatments   Labs (all labs ordered are listed, but only abnormal results are  displayed) Labs Reviewed - No data to display  EKG None  Radiology No results found.  Procedures Procedures   Medications Ordered in ED Medications - No data to display  ED Course  I have reviewed the triage vital signs and the nursing notes.  Pertinent labs & imaging results that were available during my care of the patient were reviewed by me and considered in my medical decision making (see chart for details).    MDM Rules/Calculators/A&P                           Patient with bruising of the left forearm after venipuncture.  Likely hematoma.  Discussed that bruising moving down the arm is normal due to dependence of gravity.  Patient advised apply ice and take Motrin or Tylenol for pain.  He has no neurovascular compromise and appears otherwise appropriate for discharge at this time Final Clinical Impression(s) / ED Diagnoses Final diagnoses:  None    Rx / DC Orders ED Discharge Orders     None        Arthor Captain, PA-C 07/31/21 1948    Charlynne Pander, MD 07/31/21 [phone]

## 2021-07-31 NOTE — ED Notes (Signed)
Pt ambulatory in ED lobby. 

## 2021-07-31 NOTE — ED Triage Notes (Signed)
Patient donated plasma on Monday. He they had drawing blood. Now complains of pain in the left arm and bruising.

## 2021-07-31 NOTE — Discharge Instructions (Signed)
Apply ice, take tylenol, return for new or concerning symptoms.
